# Patient Record
Sex: Female | Born: 1960 | Race: Black or African American | Hispanic: No | Marital: Married | Smoking: Never smoker
Health system: Southern US, Community
[De-identification: ages and names within clinical notes are randomized; demographics above are authoritative.]

## PROBLEM LIST (undated history)

## (undated) HISTORY — PX: ABDOMINAL HYSTERECTOMY: SHX81

---

## 2014-06-29 ENCOUNTER — Encounter (HOSPITAL_COMMUNITY): Payer: Self-pay

## 2014-06-29 ENCOUNTER — Emergency Department (HOSPITAL_COMMUNITY): Payer: Self-pay

## 2014-06-29 ENCOUNTER — Emergency Department (HOSPITAL_COMMUNITY)
Admission: EM | Admit: 2014-06-29 | Discharge: 2014-06-29 | Disposition: A | Discharge status: Home / Self Care | Payer: Self-pay | Attending: Emergency Medicine | Admitting: Emergency Medicine

## 2014-06-29 DIAGNOSIS — S161XXA Strain of muscle, fascia and tendon at neck level, initial encounter: Secondary | ICD-10-CM | POA: Insufficient documentation

## 2014-06-29 DIAGNOSIS — Y9389 Activity, other specified: Secondary | ICD-10-CM | POA: Insufficient documentation

## 2014-06-29 DIAGNOSIS — S8012XA Contusion of left lower leg, initial encounter: Secondary | ICD-10-CM | POA: Insufficient documentation

## 2014-06-29 DIAGNOSIS — S0990XA Unspecified injury of head, initial encounter: Secondary | ICD-10-CM | POA: Insufficient documentation

## 2014-06-29 DIAGNOSIS — R519 Headache, unspecified: Secondary | ICD-10-CM

## 2014-06-29 DIAGNOSIS — Y998 Other external cause status: Secondary | ICD-10-CM | POA: Insufficient documentation

## 2014-06-29 DIAGNOSIS — R51 Headache: Secondary | ICD-10-CM

## 2014-06-29 DIAGNOSIS — Y92410 Unspecified street and highway as the place of occurrence of the external cause: Secondary | ICD-10-CM | POA: Insufficient documentation

## 2014-06-29 MED ORDER — ACETAMINOPHEN 325 MG PO TABS
650.0000 mg | ORAL_TABLET | Freq: Once | ORAL | Status: AC
  Administered 2014-06-29: 650 mg via ORAL
  Filled 2014-06-29: qty 2

## 2014-06-29 MED ORDER — OXYCODONE-ACETAMINOPHEN 5-325 MG PO TABS: 1.0000 | ORAL_TABLET | Freq: Four times a day (QID) | ORAL | Status: AC | PRN

## 2014-06-29 NOTE — ED Notes (Signed)
Pt alert and oriented x4. Respirations even and unlabored, bilateral symmetrical rise and fall of chest. Skin warm and dry. In no acute distress. Denies needs.   
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
## 2014-06-29 NOTE — ED Notes (Signed)
Patient c/o headache and left shin pain. No obvious injuries.

## 2014-06-29 NOTE — ED Notes (Signed)
Bed: [facility] Expected date:  Expected time:  Means of arrival:  Comments: 
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
